# Patient Record
Sex: Female | Born: 1960 | Race: White | Hispanic: No | Smoking: Heavy tobacco smoker
Health system: Southern US, Community
[De-identification: ages and names within clinical notes are randomized; demographics above are authoritative.]

## PROBLEM LIST (undated history)

## (undated) DIAGNOSIS — E119 Type 2 diabetes mellitus without complications: Secondary | ICD-10-CM

## (undated) DIAGNOSIS — J449 Chronic obstructive pulmonary disease, unspecified: Secondary | ICD-10-CM

## (undated) HISTORY — PX: ABDOMINAL HYSTERECTOMY: SHX81

## (undated) HISTORY — PX: HERNIA REPAIR: SHX51

## (undated) HISTORY — PX: ABDOMINAL SURGERY: SHX537

## (undated) HISTORY — PX: CHOLECYSTECTOMY: SHX55

---

## 2013-05-19 ENCOUNTER — Emergency Department: Payer: Self-pay | Admitting: Emergency Medicine

## 2013-05-19 LAB — BASIC METABOLIC PANEL
Anion Gap: 7 (ref 7–16)
Calcium, Total: 8.8 mg/dL (ref 8.5–10.1)
Chloride: 108 mmol/L — ABNORMAL HIGH (ref 98–107)
Co2: 29 mmol/L (ref 21–32)
Creatinine: 0.61 mg/dL (ref 0.60–1.30)
EGFR (African American): >60
Osmolality: 285 (ref 275–301)
Potassium: 3.7 mmol/L (ref 3.5–5.1)
Sodium: 144 mmol/L (ref 136–145)

## 2013-05-19 LAB — URINALYSIS, COMPLETE
Glucose,UR: NEGATIVE mg/dL (ref 0–75)
Ketone: NEGATIVE
Nitrite: NEGATIVE
Ph: 7 (ref 4.5–8.0)
Protein: NEGATIVE
Squamous Epithelial: 1

## 2013-05-19 LAB — CBC
HGB: 10.4 g/dL — ABNORMAL LOW (ref 12.0–16.0)
MCH: 28.1 pg (ref 26.0–34.0)
Platelet: 334 10*3/uL (ref 150–440)
RBC: 3.72 10*6/uL — ABNORMAL LOW (ref 3.80–5.20)
RDW: 23.9 % — ABNORMAL HIGH (ref 11.5–14.5)

## 2013-06-06 ENCOUNTER — Emergency Department: Payer: Self-pay | Admitting: Emergency Medicine

## 2013-06-06 LAB — URINALYSIS, COMPLETE
Bilirubin,UR: NEGATIVE
Glucose,UR: NEGATIVE mg/dL (ref 0–75)
Protein: NEGATIVE
Specific Gravity: 1.011 (ref 1.003–1.030)
WBC UR: <1 /HPF (ref 0–5)

## 2013-06-06 LAB — COMPREHENSIVE METABOLIC PANEL
Albumin: 3.8 g/dL (ref 3.4–5.0)
Alkaline Phosphatase: 81 U/L (ref 50–136)
Anion Gap: 5 — ABNORMAL LOW (ref 7–16)
BUN: 14 mg/dL (ref 7–18)
Bilirubin,Total: 0.1 mg/dL — ABNORMAL LOW (ref 0.2–1.0)
Co2: 28 mmol/L (ref 21–32)
Creatinine: 0.62 mg/dL (ref 0.60–1.30)
EGFR (African American): >60
Glucose: 119 mg/dL — ABNORMAL HIGH (ref 65–99)
Osmolality: 281 (ref 275–301)
Potassium: 3.5 mmol/L (ref 3.5–5.1)
SGOT(AST): 16 U/L (ref 15–37)
SGPT (ALT): 16 U/L (ref 12–78)
Total Protein: 6.9 g/dL (ref 6.4–8.2)

## 2013-06-06 LAB — CBC
HCT: 28.4 % — ABNORMAL LOW (ref 35.0–47.0)
HGB: 9.4 g/dL — ABNORMAL LOW (ref 12.0–16.0)
MCH: 27.5 pg (ref 26.0–34.0)
MCHC: 32.9 g/dL (ref 32.0–36.0)
MCV: 84 fL (ref 80–100)

## 2013-06-07 LAB — TROPONIN I: Troponin-I: <0.02 ng/mL

## 2017-08-16 ENCOUNTER — Emergency Department (HOSPITAL_COMMUNITY)
Admission: EM | Admit: 2017-08-16 | Discharge: 2017-08-16 | Disposition: A | Discharge status: Home / Self Care | Payer: Medicaid Other | Attending: Emergency Medicine | Admitting: Emergency Medicine

## 2017-08-16 ENCOUNTER — Encounter (HOSPITAL_COMMUNITY): Payer: Self-pay | Admitting: Emergency Medicine

## 2017-08-16 ENCOUNTER — Emergency Department (HOSPITAL_COMMUNITY): Payer: Medicaid Other

## 2017-08-16 DIAGNOSIS — F17210 Nicotine dependence, cigarettes, uncomplicated: Secondary | ICD-10-CM | POA: Diagnosis not present

## 2017-08-16 DIAGNOSIS — E119 Type 2 diabetes mellitus without complications: Secondary | ICD-10-CM | POA: Diagnosis not present

## 2017-08-16 DIAGNOSIS — R059 Cough, unspecified: Secondary | ICD-10-CM

## 2017-08-16 DIAGNOSIS — J449 Chronic obstructive pulmonary disease, unspecified: Secondary | ICD-10-CM | POA: Diagnosis not present

## 2017-08-16 DIAGNOSIS — R05 Cough: Secondary | ICD-10-CM | POA: Diagnosis not present

## 2017-08-16 DIAGNOSIS — R0602 Shortness of breath: Secondary | ICD-10-CM | POA: Diagnosis present

## 2017-08-16 DIAGNOSIS — R1030 Lower abdominal pain, unspecified: Secondary | ICD-10-CM | POA: Diagnosis not present

## 2017-08-16 DIAGNOSIS — R109 Unspecified abdominal pain: Secondary | ICD-10-CM

## 2017-08-16 HISTORY — DX: Chronic obstructive pulmonary disease, unspecified: J44.9

## 2017-08-16 HISTORY — DX: Type 2 diabetes mellitus without complications: E11.9

## 2017-08-16 LAB — CBC
HEMATOCRIT: 41.9 % (ref 36.0–46.0)
HEMOGLOBIN: 14.2 g/dL (ref 12.0–15.0)
MCH: 32.6 pg (ref 26.0–34.0)
MCHC: 33.9 g/dL (ref 30.0–36.0)
MCV: 96.1 fL (ref 78.0–100.0)
Platelets: 325 10*3/uL (ref 150–400)
RBC: 4.36 MIL/uL (ref 3.87–5.11)
RDW: 12.8 % (ref 11.5–15.5)
WBC: 12.2 10*3/uL — AB (ref 4.0–10.5)

## 2017-08-16 LAB — APTT: aPTT: 29 seconds (ref 24–36)

## 2017-08-16 LAB — HEPATIC FUNCTION PANEL
ALK PHOS: 87 U/L (ref 38–126)
ALT: 14 U/L (ref 14–54)
AST: 23 U/L (ref 15–41)
Albumin: 4.4 g/dL (ref 3.5–5.0)
BILIRUBIN TOTAL: 0.3 mg/dL (ref 0.3–1.2)
Total Protein: 8 g/dL (ref 6.5–8.1)

## 2017-08-16 LAB — BASIC METABOLIC PANEL
ANION GAP: 12 (ref 5–15)
BUN: 7 mg/dL (ref 6–20)
CALCIUM: 9.3 mg/dL (ref 8.9–10.3)
CO2: 22 mmol/L (ref 22–32)
Chloride: 103 mmol/L (ref 101–111)
Creatinine, Ser: 0.62 mg/dL (ref 0.44–1.00)
Glucose, Bld: 101 mg/dL — ABNORMAL HIGH (ref 65–99)
Potassium: 3.1 mmol/L — ABNORMAL LOW (ref 3.5–5.1)
SODIUM: 137 mmol/L (ref 135–145)

## 2017-08-16 LAB — POC OCCULT BLOOD, ED: FECAL OCCULT BLD: NEGATIVE

## 2017-08-16 LAB — TYPE AND SCREEN
ABO/RH(D): A POS
Antibody Screen: NEGATIVE

## 2017-08-16 LAB — PROTIME-INR
INR: 0.86
Prothrombin Time: 11.7 seconds (ref 11.4–15.2)

## 2017-08-16 LAB — ABO/RH: ABO/RH(D): A POS

## 2017-08-16 MED ORDER — PROMETHAZINE HCL 25 MG/ML IJ SOLN
25.0000 mg | Freq: Once | INTRAMUSCULAR | Status: AC
  Administered 2017-08-16: 25 mg via INTRAVENOUS
  Filled 2017-08-16: qty 1

## 2017-08-16 MED ORDER — PREDNISONE 20 MG PO TABS: ORAL_TABLET | ORAL | 0 refills | Status: AC

## 2017-08-16 MED ORDER — ALBUTEROL SULFATE (2.5 MG/3ML) 0.083% IN NEBU
5.0000 mg | INHALATION_SOLUTION | Freq: Once | RESPIRATORY_TRACT | Status: AC
  Administered 2017-08-16: 5 mg via RESPIRATORY_TRACT
  Filled 2017-08-16: qty 6

## 2017-08-16 MED ORDER — METHYLPREDNISOLONE SODIUM SUCC 125 MG IJ SOLR
125.0000 mg | Freq: Once | INTRAMUSCULAR | Status: AC
  Administered 2017-08-16: 125 mg via INTRAVENOUS
  Filled 2017-08-16: qty 2

## 2017-08-16 MED ORDER — IPRATROPIUM BROMIDE 0.02 % IN SOLN
0.5000 mg | Freq: Once | RESPIRATORY_TRACT | Status: AC
  Administered 2017-08-16: 0.5 mg via RESPIRATORY_TRACT
  Filled 2017-08-16: qty 2.5

## 2017-08-16 MED ORDER — DOXYCYCLINE HYCLATE 100 MG PO CAPS: 100.0000 mg | ORAL_CAPSULE | Freq: Two times a day (BID) | ORAL | 0 refills | Status: AC

## 2017-08-16 MED ORDER — ALBUTEROL SULFATE HFA 108 (90 BASE) MCG/ACT IN AERS: 1.0000 | INHALATION_SPRAY | Freq: Four times a day (QID) | RESPIRATORY_TRACT | 0 refills | Status: AC | PRN

## 2017-08-16 NOTE — ED Provider Notes (Signed)
[facility]-EMERGENCY DEPT Provider Note   CSN: [id number] Arrival date & time: 08/16/17  4098     History   Chief Complaint Chief Complaint  Patient presents with  . Shortness of Breath  . Cough    HPI Hannah Mckinney is a 56 y.o. female.  The history is provided by the patient and medical records.  Shortness of Breath  Associated symptoms include cough and abdominal pain.  Cough  Associated symptoms include shortness of breath.    56 y.o. F with hx of COPD, DM, emphysema, presenting to the ED for SOB.  States this has been ongoing for the past few days, seems to be getting worse.  She reports thick, white mucous.  Wheezing intermittently as well.  Reports recurrent issues with bronchitis/pneumonia.  Has already had flu and pneumonia shots this year.  No fever, chills.    States she has been having ongoing lower abdominal pain as well.  States she was recently hospitalized in [place] recently for SBO.  She was there for several days.  States she has had some vomiting at home.  Reports she began experiencing dark, tarry stool last evening.  States they were loose but not quite diarrhea.  Reports hx of requiring blood transfusions in the past for similar.  She is not currently on anticoagulation.  Prior abdominal surgeries include hysterectomy, cholecystectomy, hernia repair.  Past Medical History:  Diagnosis Date  . COPD (chronic obstructive pulmonary disease) (HCC)   . Diabetes mellitus without complication (HCC)     There are no active problems to display for this patient.   Past Surgical History:  Procedure Laterality Date  . ABDOMINAL HYSTERECTOMY    . ABDOMINAL SURGERY    . CHOLECYSTECTOMY    . HERNIA REPAIR      OB History    No data available       Home Medications    Prior to Admission medications   Not on File    Family History History reviewed. No pertinent family history.  Social History Social History  Substance Use Topics  . Smoking status: Heavy  Tobacco Smoker    Packs/day: 1.00    Types: Cigarettes  . Smokeless tobacco: Never Used  . Alcohol use Yes     Allergies   Contrast media [iodinated diagnostic agents]; Toradol [ketorolac tromethamine]; and Tramadol   Review of Systems Review of Systems  Respiratory: Positive for cough and shortness of breath.   Gastrointestinal: Positive for abdominal pain.       Black stools  All other systems reviewed and are negative.    Physical Exam Updated Vital Signs BP (!) 140/109 (BP Location: Left Arm)   Pulse 92   Temp 98.2 F (36.8 C) (Oral)   Resp 15   Ht  (1.6 m)   Wt 70.3 kg (155 lb)   SpO2 98%   BMI 27.46 kg/m   Physical Exam  Constitutional: She is oriented to person, place, and time. She appears well-developed and well-nourished.  HENT:  Head: Normocephalic and atraumatic.  Mouth/Throat: Oropharynx is clear and moist.  Eyes: Pupils are equal, round, and reactive to light. Conjunctivae and EOM are normal.  Neck: Normal range of motion.  Cardiovascular: Normal rate, regular rhythm and normal heart sounds.   Pulmonary/Chest: Effort normal and breath sounds normal. No respiratory distress. She has no wheezes.  Wet cough, coarse breath sounds bilaterally, no audible wheezing  Abdominal: Soft. Bowel sounds are normal. There is no tenderness. There is no rebound.  Genitourinary: Rectum normal.  Genitourinary Comments: No bleeding, no melena or hematochezia  Musculoskeletal: Normal range of motion.  Neurological: She is alert and oriented to person, place, and time.  Skin: Skin is warm and dry.  Psychiatric: She has a normal mood and affect.  Nursing note and vitals reviewed.    ED Treatments / Results  Labs (all labs ordered are listed, but only abnormal results are displayed) Labs Reviewed  CBC - Abnormal; Notable for the following:       Result Value   WBC 12.2 (*)    All other components within normal limits  HEPATIC FUNCTION PANEL - Abnormal; Notable  for the following:    Bilirubin, Direct <0.1 (*)    All other components within normal limits  BASIC METABOLIC PANEL - Abnormal; Notable for the following:    Potassium 3.1 (*)    Glucose, Bld 101 (*)    All other components within normal limits  PROTIME-INR  APTT  POC OCCULT BLOOD, ED  TYPE AND SCREEN  ABO/RH    EKG  EKG Interpretation  Date/Time:  Tuesday August 16 2017 02:52:06 EDT Ventricular Rate:  93 PR Interval:    QRS Duration: 96 QT Interval:  382 QTC Calculation: 476 R Axis:   -27 Text Interpretation:  Sinus rhythm Abnormal R-wave progression, early transition Left ventricular hypertrophy Confirmed by Palumbo, April ([id number]) on 08/16/2017 6:24:54 AM       Radiology Dg Chest 2 View  Result Date: 08/16/2017 CLINICAL DATA:  Cough and shortness of breath for past several hours, COPD and emphysema, smoker EXAM: CHEST  2 VIEW COMPARISON:  None. FINDINGS: Normal heart size and pulmonary vascularity. No focal airspace disease or consolidation in the lungs. No blunting of costophrenic angles. No pneumothorax. Mediastinal contours appear intact. Tortuous aorta. Surgical clips in the right upper quadrant. IMPRESSION: No active cardiopulmonary disease. Electronically Signed   By: Burman Nieves M.D.   On: 08/16/2017 03:18    Procedures Procedures (including critical care time)  Medications Ordered in ED Medications  albuterol (PROVENTIL) (2.5 MG/3ML) 0.083% nebulizer solution 5 mg (5 mg Nebulization Given 08/16/17 0423)  methylPREDNISolone sodium succinate (SOLU-MEDROL) 125 mg/2 mL injection 125 mg (125 mg Intravenous Given 08/16/17 0403)  ipratropium (ATROVENT) nebulizer solution 0.5 mg (0.5 mg Nebulization Given 08/16/17 0423)  promethazine (PHENERGAN) injection 25 mg (25 mg Intravenous Given 08/16/17 0403)     Initial Impression / Assessment and Plan / ED Course  I have reviewed the triage vital signs and the nursing notes.  Pertinent labs & imaging results that were  available during my care of the patient were reviewed by me and considered in my medical decision making (see chart for details).  56 y.o. F here with multiple complaints-- cough, SOB, abdominal pain, dark stools.  Reports recent SBO with hospital admission as well as hx of GI bleeding.  Reports this was in [place]. [place]-- records not available in care everywhere.  She is afebrile, non-toxic.  Coughing repeatedly during exam.  Lung sounds are coarse.  Vitals stable.  Rectal exam is normal, guaiac negative.  Screening labs sent and are overall reassuring, slight leukocytosis noted.  Mild hypokalemia-- encouraged potassium rich foods. Her H&H is stable.  Chest x-ray is negative. Abdominal films with mild ileus, possible enteritis.  Results discussed with patient, she is feeling better here after nebs, steroids, and Phenergan.  Will PO trial.  5:47 AM Patient tolerating PO here.  No active emesis in the ED.  Coughing improved after  nebs.  Lung sounds improved. O2 sats stable at 95% during repeat assessment.  States she is feeling better. Will discharge home.  Given her hx of COPD/emphysema as well as reported recent hospitalization, will treat with abx, steroids, etc.  Patient reports she is on her way home to [place].  Recommended close follow-up with PCP once returning home.  Discussed plan with patient, she acknowledged understanding and agreed with plan of care.  Return precautions given for new or worsening symptoms.  Case discussed with attending physician, Dr. Nicanor Alcon, agrees with assessment and plan of care.  Final Clinical Impressions(s) / ED Diagnoses   Final diagnoses:  Abdominal pain  Cough  Shortness of breath    New Prescriptions New Prescriptions   No medications on file     Garlon Hatchet, PA-C 08/16/17 1610    Nicanor Alcon, April, MD 08/16/17 [phone]

## 2017-08-16 NOTE — Discharge Instructions (Signed)
Take the prescribed medication as directed. Follow-up with your primary care doctor once you get back home. Return to the ED for new or worsening symptoms.

## 2017-08-16 NOTE — ED Notes (Signed)
Patient is alert and oriented x3.  She was given DC instructions and follow up visit instructions.  Patient gave verbal understanding. She was DC ambulatory under her own power to home.  V/S stable.  He was not showing any signs of distress on DC 

## 2017-08-16 NOTE — ED Triage Notes (Signed)
Pt reports having cough and shortness of breath for the last several days. Pt states that she has hx of COPD/empasyema. Pt reports a productive cough.

## 2017-08-16 NOTE — ED Notes (Signed)
ED Provider at bedside. 

## 2018-09-28 IMAGING — CR DG CHEST 2V
2 series · 2 of 2 positions shown · non-contrast
Comparison: None.

CLINICAL DATA: Cough and shortness of breath for past several
hours, COPD and emphysema, smoker

EXAM:
CHEST  2 VIEW

[w chest pa]
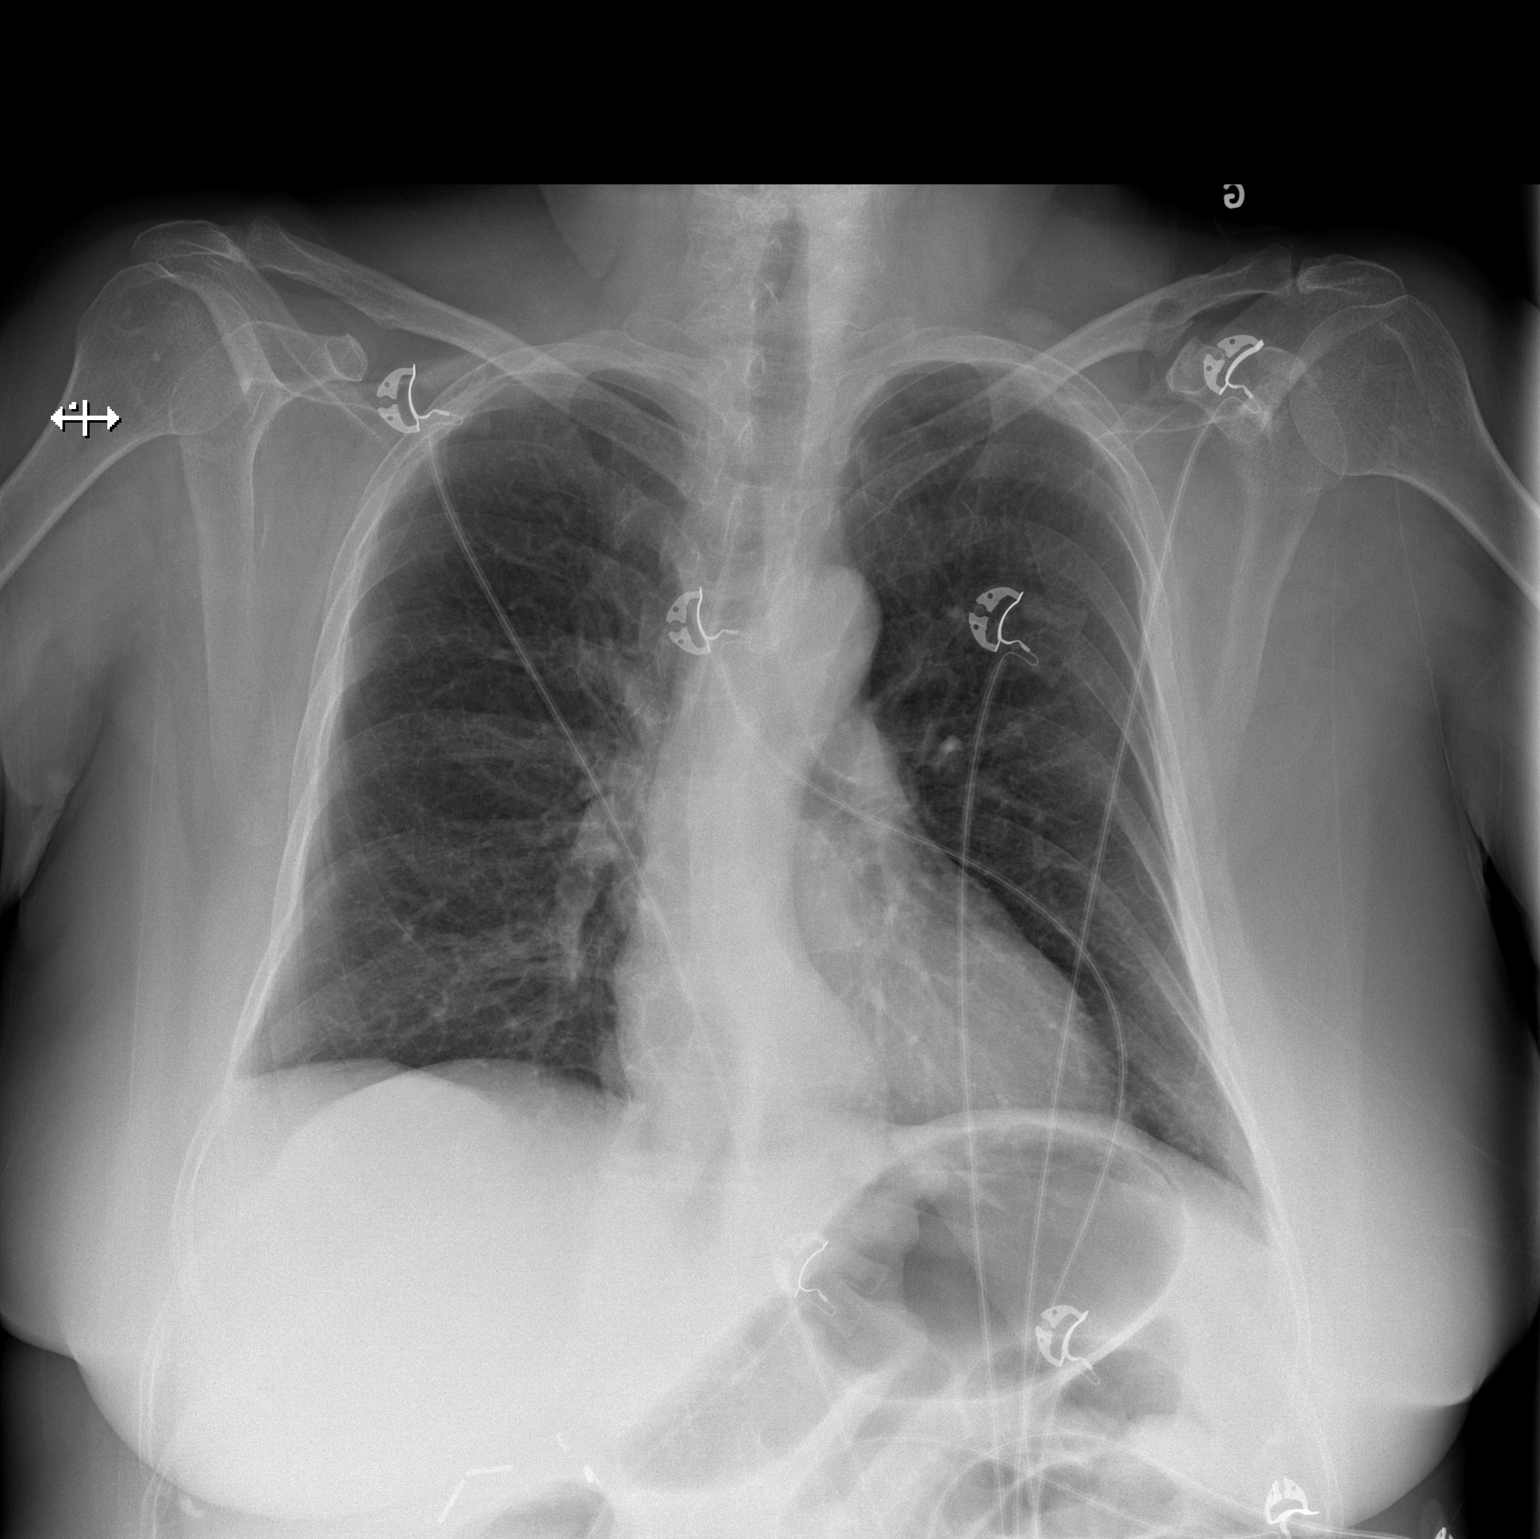

[w chest lat]
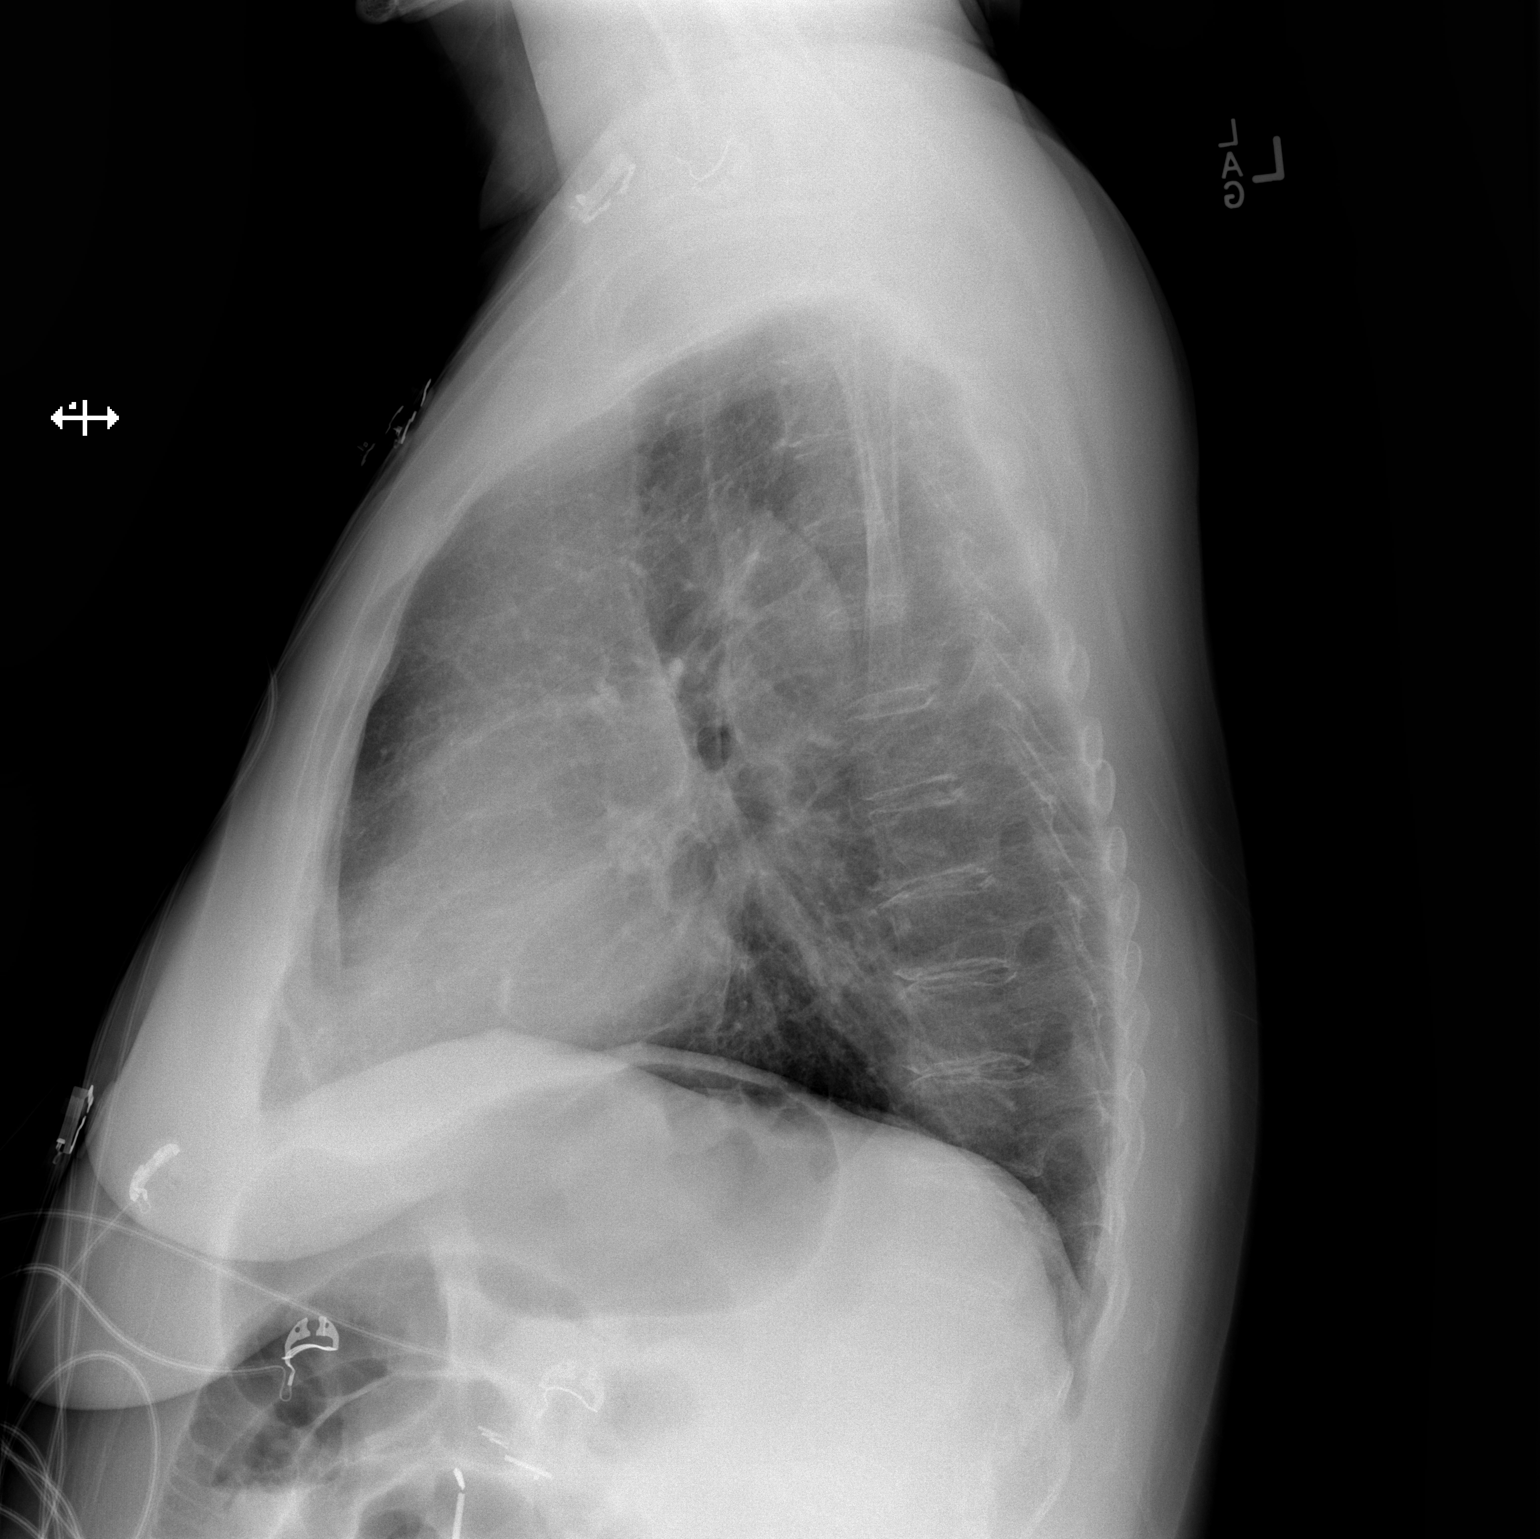

[2 of 2 positions shown; findings below may reference images not displayed]

FINDINGS: Normal heart size and pulmonary vascularity. No focal airspace
disease or consolidation in the lungs. No blunting of costophrenic
angles. No pneumothorax. Mediastinal contours appear intact.
Tortuous aorta. Surgical clips in the right upper quadrant.
IMPRESSION: No active cardiopulmonary disease.

## 2018-09-28 IMAGING — CR DG ABDOMEN 2V
3 series · 3 of 3 positions shown · non-contrast
Comparison: CT abdomen and pelvis June 07, 2013

CLINICAL DATA: Mid abdominal pain for 1 day.

EXAM:
ABDOMEN - 2 VIEW

[w abdomen upright]
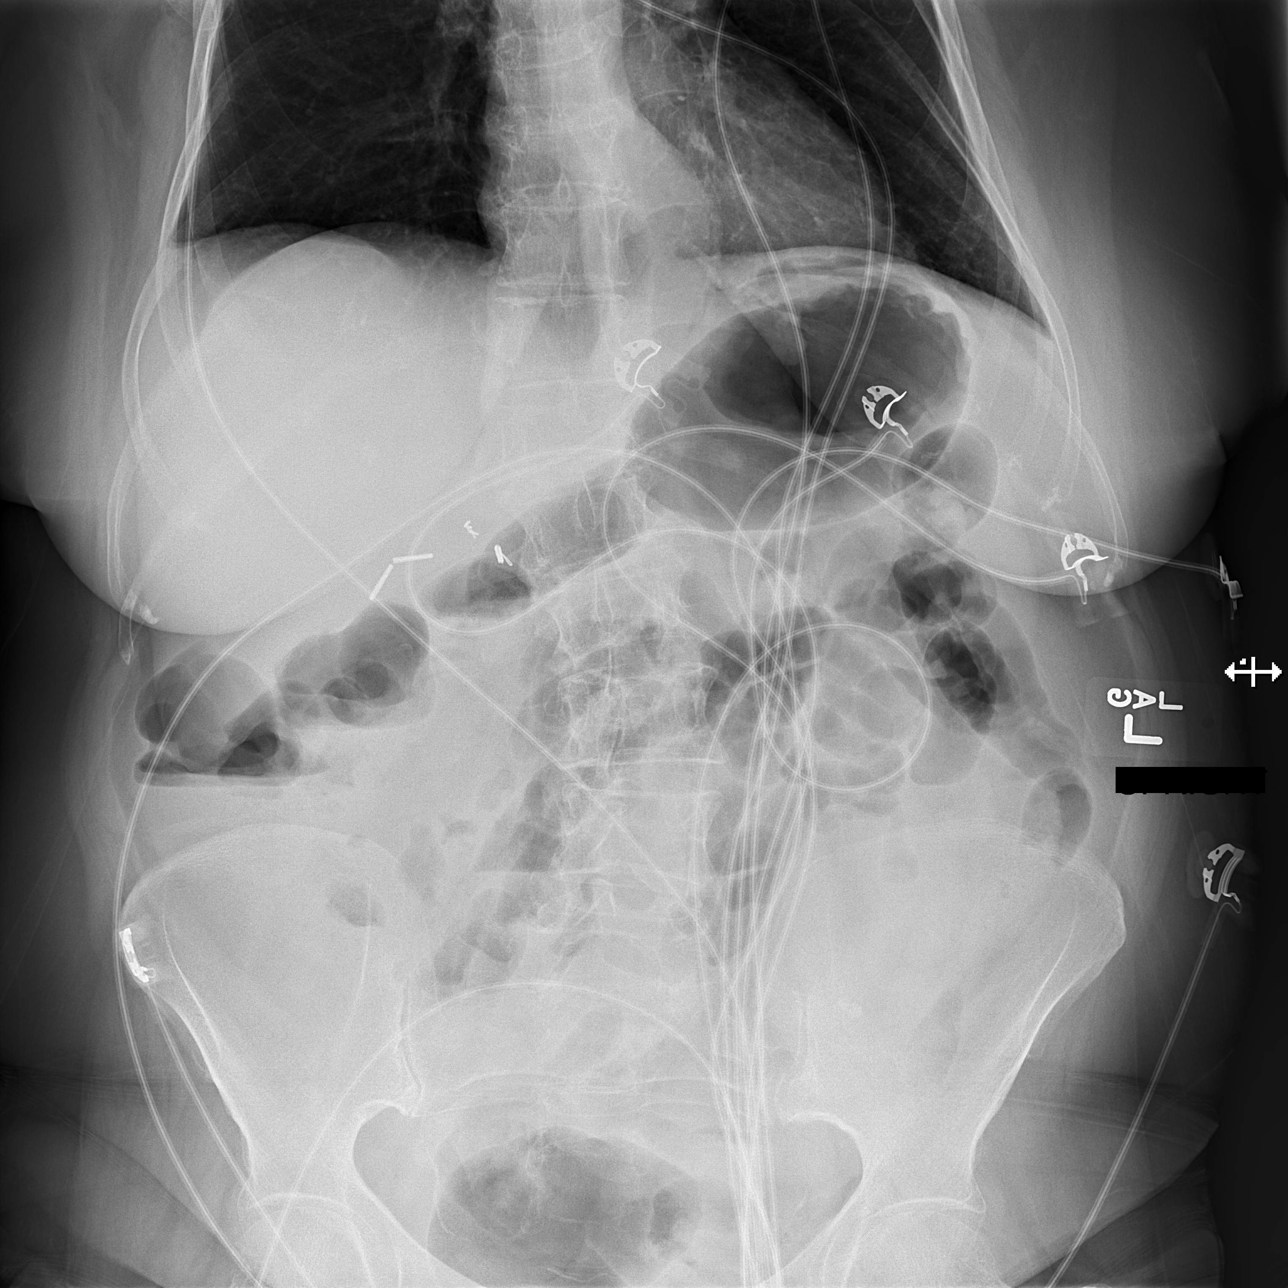

[t abdomen supine (1 of 2)]
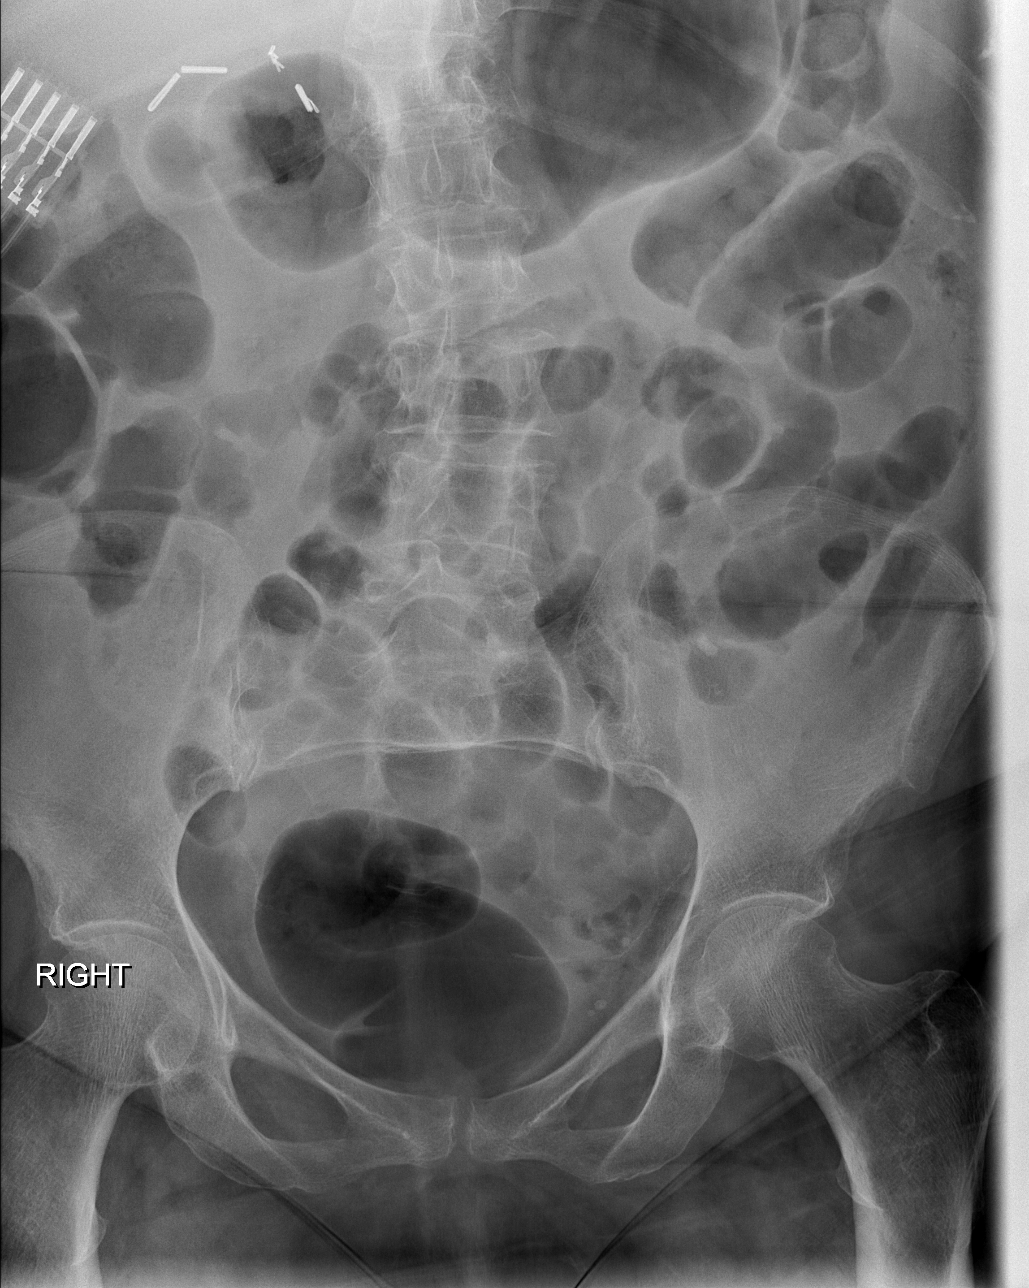

[t abdomen supine (2 of 2)]
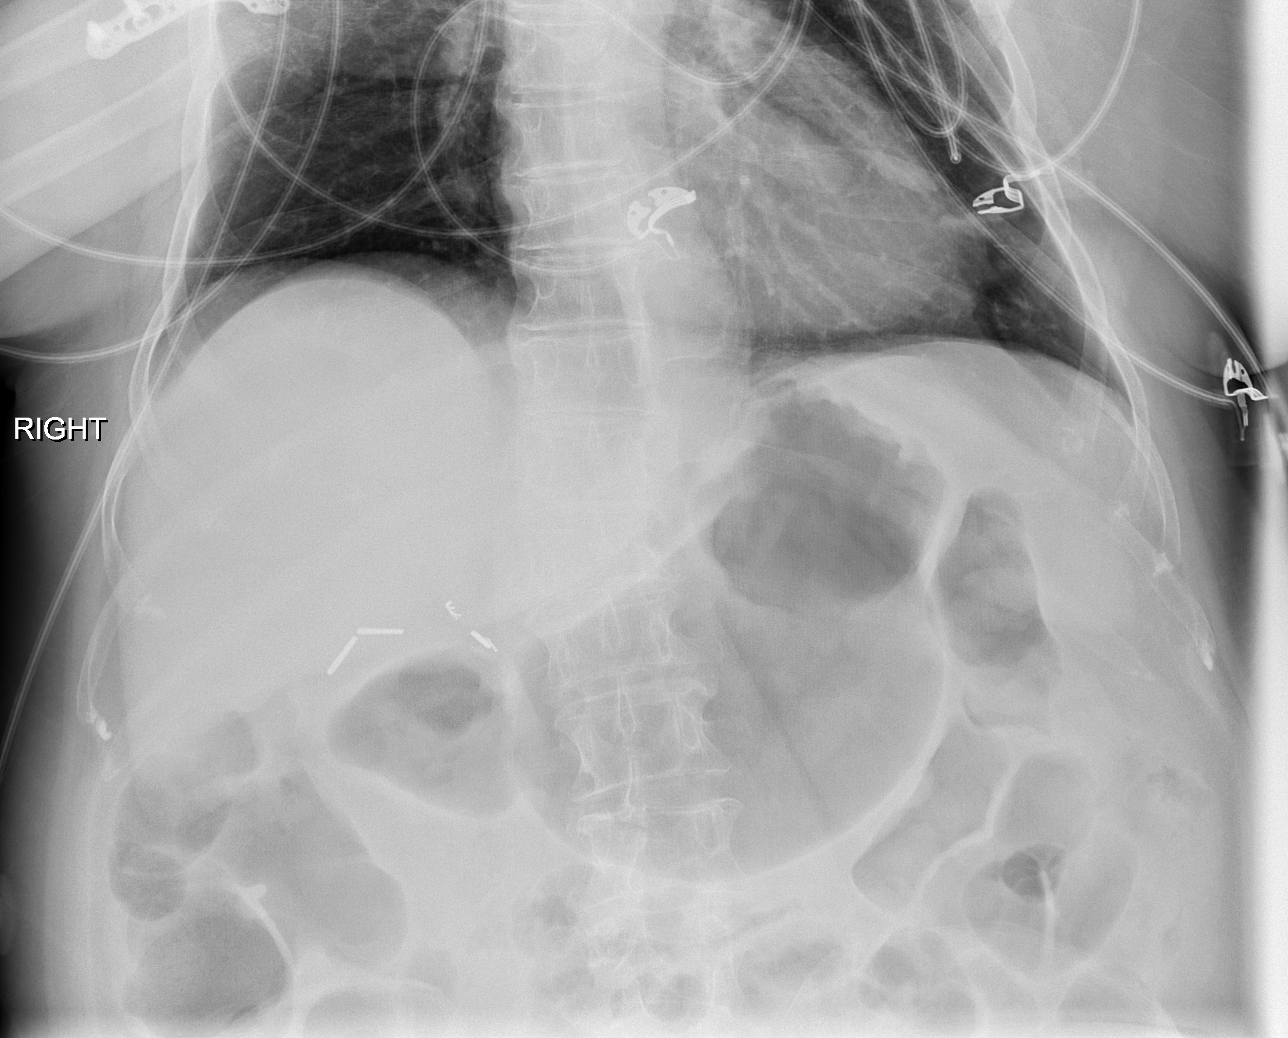

[3 of 3 positions shown; findings below may reference images not displayed]

FINDINGS: Multiple loops of gas-filled nondistended small bowel with air-fluid
levels at similar levels. Gas projects in the rectum. Surgical clips
in the included right abdomen compatible with cholecystectomy. No
intra-abdominal mass effect or pathologic calcifications. No
intraperitoneal free air. Small calcification projecting over LEFT
iliac corresponds to calcified retroperitoneal lymph node on prior
CT. Mild levoscoliosis lumbar spine. Small hiatal hernia.
IMPRESSION: Mild ileus and possible enteritis.
